# Patient Record
Sex: Male | Born: 1960 | Race: White | Hispanic: No | Marital: Single | Smoking: Never smoker
Health system: Southern US, Community
[De-identification: ages and names within clinical notes are randomized; demographics above are authoritative.]

## PROBLEM LIST (undated history)

## (undated) DIAGNOSIS — L409 Psoriasis, unspecified: Secondary | ICD-10-CM

## (undated) DIAGNOSIS — M549 Dorsalgia, unspecified: Secondary | ICD-10-CM

## (undated) DIAGNOSIS — G8929 Other chronic pain: Secondary | ICD-10-CM

## (undated) HISTORY — PX: BACK SURGERY: SHX140

---

## 2012-01-09 ENCOUNTER — Emergency Department (HOSPITAL_BASED_OUTPATIENT_CLINIC_OR_DEPARTMENT_OTHER)
Admission: EM | Admit: 2012-01-09 | Discharge: 2012-01-09 | Discharge status: Against Medical Advice | Payer: 59 | Attending: Emergency Medicine | Admitting: Emergency Medicine

## 2012-01-09 ENCOUNTER — Encounter (HOSPITAL_BASED_OUTPATIENT_CLINIC_OR_DEPARTMENT_OTHER): Payer: Self-pay | Admitting: *Deleted

## 2012-01-09 DIAGNOSIS — IMO0002 Reserved for concepts with insufficient information to code with codable children: Secondary | ICD-10-CM | POA: Insufficient documentation

## 2012-01-09 DIAGNOSIS — X58XXXA Exposure to other specified factors, initial encounter: Secondary | ICD-10-CM | POA: Insufficient documentation

## 2012-01-09 HISTORY — DX: Psoriasis, unspecified: L40.9

## 2012-01-09 HISTORY — DX: Dorsalgia, unspecified: M54.9

## 2012-01-09 HISTORY — DX: Other chronic pain: G89.29

## 2012-01-09 NOTE — ED Notes (Signed)
Has a blister on his right upper leg that his pants rubs against and causes bleeding and irritation constantly. He cannot figure out how to wrap it to prevent this from happening.

## 2012-01-09 NOTE — ED Notes (Signed)
Called in w/a no answer 

## 2012-01-09 NOTE — ED Notes (Signed)
No answer in w/a after being called x 2

## 2012-01-09 NOTE — ED Notes (Signed)
Unable to locate pt in the waiting room.

## 2012-01-10 ENCOUNTER — Emergency Department (HOSPITAL_BASED_OUTPATIENT_CLINIC_OR_DEPARTMENT_OTHER)
Admission: EM | Admit: 2012-01-10 | Discharge: 2012-01-10 | Disposition: A | Discharge status: Home / Self Care | Payer: 59 | Attending: Emergency Medicine | Admitting: Emergency Medicine

## 2012-01-10 ENCOUNTER — Encounter (HOSPITAL_BASED_OUTPATIENT_CLINIC_OR_DEPARTMENT_OTHER): Payer: Self-pay | Admitting: Emergency Medicine

## 2012-01-10 DIAGNOSIS — G8929 Other chronic pain: Secondary | ICD-10-CM | POA: Insufficient documentation

## 2012-01-10 DIAGNOSIS — L02419 Cutaneous abscess of limb, unspecified: Secondary | ICD-10-CM | POA: Insufficient documentation

## 2012-01-10 DIAGNOSIS — L03119 Cellulitis of unspecified part of limb: Secondary | ICD-10-CM

## 2012-01-10 DIAGNOSIS — M549 Dorsalgia, unspecified: Secondary | ICD-10-CM | POA: Insufficient documentation

## 2012-01-10 MED ORDER — CEPHALEXIN 500 MG PO CAPS: 500.0000 mg | ORAL_CAPSULE | Freq: Four times a day (QID) | ORAL | Status: AC

## 2012-01-10 MED ORDER — CEPHALEXIN 250 MG PO CAPS
500.0000 mg | ORAL_CAPSULE | Freq: Once | ORAL | Status: AC
  Administered 2012-01-10: 500 mg via ORAL
  Filled 2012-01-10 (×2): qty 1

## 2012-01-10 MED ORDER — SULFAMETHOXAZOLE-TMP DS 800-160 MG PO TABS: 2.0000 | ORAL_TABLET | Freq: Two times a day (BID) | ORAL | Status: AC

## 2012-01-10 MED ORDER — SULFAMETHOXAZOLE-TMP DS 800-160 MG PO TABS
2.0000 | ORAL_TABLET | Freq: Once | ORAL | Status: AC
  Administered 2012-01-10: 2 via ORAL
  Filled 2012-01-10: qty 2

## 2012-01-10 NOTE — ED Provider Notes (Signed)
History     CSN: [id number]  Arrival date & time 01/10/12  0155   First MD Initiated Contact with Patient 01/10/12 0220      Chief Complaint  Patient presents with  . Mass    (Consider location/radiation/quality/duration/timing/severity/associated sxs/prior treatment) HPI Patient is a 51 year old male who presents today complaining of right inner thigh pain with associated rash for the past week. Patient has had pain in the lump on his right inner thigh that he's noted over the past year he feels like this intermittently gets swollen and the swelling goes down. Over the past week this area has gotten very red. Patient also notes that he has had bloody drainage from this as well. Patient has an area that is proximally 6 inches in length and 4 inches in diameter on the right inner thigh with one small 2 cm opening that has been draining. This is not draining here. Pain as a 9/10. This is worse with palpation and walking due to friction. Resting in use of home pain medications for chronic back pain has made it feel better. Patient denies fevers, nausea, vomiting or history of diabetes or immunocompromise.There are no other associated or modifying factors.   Past Medical History  Diagnosis Date  . Psoriasis   . Chronic back pain     Past Surgical History  Procedure Date  . Back surgery     History reviewed. No pertinent family history.  History  Substance Use Topics  . Smoking status: Never Smoker   . Smokeless tobacco: Not on file  . Alcohol Use: Yes      Review of Systems  HENT: Negative.   Eyes: Negative.   Respiratory: Negative.   Cardiovascular: Negative.   Gastrointestinal: Negative.   Genitourinary: Negative.   Musculoskeletal: Negative.   Skin: Positive for rash.       See HPI  Neurological: Negative.   Hematological: Negative.   Psychiatric/Behavioral: Negative.   All other systems reviewed and are negative.    Allergies  Codeine  Home Medications    Current Outpatient Rx  Name Route Sig Dispense Refill  . OXYCODONE-ACETAMINOPHEN 10-325 MG PO TABS Oral Take 1 tablet by mouth every 4 (four) hours as needed.    Marland Kitchen TRAMADOL HCL 50 MG PO TABS Oral Take 50 mg by mouth every 6 (six) hours as needed.    . CEPHALEXIN 500 MG PO CAPS Oral Take 1 capsule (500 mg total) by mouth 4 (four) times daily. 40 capsule 0  . SULFAMETHOXAZOLE-TMP DS 800-160 MG PO TABS Oral Take 2 tablets by mouth 2 (two) times daily. 40 tablet 0    BP 149/83  Pulse 71  Temp(Src) 98.3 F (36.8 C) (Oral)  Resp 16  SpO2 99%  Physical Exam  Nursing note and vitals reviewed. GEN: Well-developed, well-nourished male in no distress HEENT: Atraumatic, normocephalic. EYES: PERRLA BL, no scleral icterus. NECK: Trachea midline, no meningismus CV: regular rate and rhythm PULM: No respiratory distress.   Neuro: cranial nerves 2-12 grossly intact, no abnormalities of strength or sensation, A and O x 3 MSK: Patient moves all 4 extremities symmetrically, no deformity, edema, or injury noted Skin: Six-inch by 4 inch area of erythema noted over the right inner thigh with a 2 cm linear area that has drained pus previously and is not currently draining. Patient has one area within this is approximately an inch in diameter there is a palpable lump but does not feel indurated or fluctuant. Psych: no abnormality of mood  ED Course  Procedures (including critical care time)  Labs Reviewed - No data to display No results found.   1. Cellulitis and abscess of leg       MDM  Patient was evaluated by myself. Based on evaluation patient did appear to have a cellulitis with an abscess that had been draining. Patient has not been on any antibiotics. He preferred conservative management this evening. Patient was advised to use hot compresses over the opening within his cellulitic area. He came use ice otherwise for pain control. Patient was given 2 tabs of double strength Bactrim as  well as Keflex and discharged with 10 days of both of these. Patient said he can use his home pain medications for pain control. Patient was advised that he he does not have significant improvement in his symptoms within 48 hours that he should return. Patient had outlined place and was told that if his erythema extends beyond this despite antibiotics he should also return. Patient was discharged in good condition.        Cyndra Numbers, MD 01/10/12 [phone]

## 2012-01-10 NOTE — ED Notes (Signed)
Lump on right leg causing pain, present for several months, started to cause pain in last week

## 2012-01-10 NOTE — Discharge Instructions (Signed)
Use ice packs over the reddened area of scan for pain control. Use hot compresses over the open area that has been draining as well as massage to improve drainage of fluid. Do not attempt to open his your self. Please return if you develop worsening of the redness despite antibiotics, nausea, vomiting, fevers, or worsening symptoms even though you are now on antibiotics. Please take your complete course of antibiotics. Abscess An abscess (boil or furuncle) is an infected area that contains a collection of pus.  SYMPTOMS Signs and symptoms of an abscess include pain, tenderness, redness, or hardness. You may feel a moveable soft area under your skin. An abscess can occur anywhere in the body.  TREATMENT  A surgical cut (incision) may be made over your abscess to drain the pus. Gauze may be packed into the space or a drain may be looped through the abscess cavity (pocket). This provides a drain that will allow the cavity to heal from the inside outwards. The abscess may be painful for a few days, but should feel much better if it was drained.  Your abscess, if seen early, may not have localized and may not have been drained. If not, another appointment may be required if it does not get better on its own or with medications. HOME CARE INSTRUCTIONS   Only take over-the-counter or prescription medicines for pain, discomfort, or fever as directed by your caregiver.   Take your antibiotics as directed if they were prescribed. Finish them even if you start to feel better.   Keep the skin and clothes clean around your abscess.   If the abscess was drained, you will need to use gauze dressing to collect any draining pus. Dressings will typically need to be changed 3 or more times a day.   The infection may spread by skin contact with others. Avoid skin contact as much as possible.   Practice good hygiene. This includes regular hand washing, cover any draining skin lesions, and do not share personal care  items.   If you participate in sports, do not share athletic equipment, towels, whirlpools, or personal care items. Shower after every practice or tournament.   If a draining area cannot be adequately covered:   Do not participate in sports.   Children should not participate in day care until the wound has healed or drainage stops.   If your caregiver has given you a follow-up appointment, it is very important to keep that appointment. Not keeping the appointment could result in a much worse infection, chronic or permanent injury, pain, and disability. If there is any problem keeping the appointment, you must call back to this facility for assistance.  SEEK MEDICAL CARE IF:   You develop increased pain, swelling, redness, drainage, or bleeding in the wound site.   You develop signs of generalized infection including muscle aches, chills, fever, or a general ill feeling.   You have an oral temperature above 102 F (38.9 C).  MAKE SURE YOU:   Understand these instructions.   Will watch your condition.   Will get help right away if you are not doing well or get worse.  Document Released: 06/22/2005 Document Revised: 09/01/2011 Document Reviewed: 04/15/2008 [facility] Patient Information 2012 [place], [place].Cellulitis Cellulitis is an infection of the skin and the tissue beneath it. The area is typically red and tender. It is caused by germs (bacteria) (usually staph or strep) that enter the body through cuts or sores. Cellulitis most commonly occurs in the arms  or lower legs.  HOME CARE INSTRUCTIONS   If you are given a prescription for medications which kill germs (antibiotics), take as directed until finished.   If the infection is on the arm or leg, keep the limb elevated as able.   Use a warm cloth several times per day to relieve pain and encourage healing.   See your caregiver for recheck of the infected site as directed if problems arise.   Only take over-the-counter or  prescription medicines for pain, discomfort, or fever as directed by your caregiver.  SEEK MEDICAL CARE IF:   The area of redness (inflammation) is spreading, there are red streaks coming from the infected site, or if a part of the infection begins to turn dark in color.   The joint or bone underneath the infected skin becomes painful after the skin has healed.   The infection returns in the same or another area after it seems to have gone away.   A boil or bump swells up. This may be an abscess.   New, unexplained problems such as pain or fever develop.  SEEK IMMEDIATE MEDICAL CARE IF:   You have a fever.   You or your child feels drowsy or lethargic.   There is vomiting, diarrhea, or lasting discomfort or feeling ill (malaise) with muscle aches and pains.  MAKE SURE YOU:   Understand these instructions.   Will watch your condition.   Will get help right away if you are not doing well or get worse.  Document Released: 06/22/2005 Document Revised: 09/01/2011 Document Reviewed: 04/30/2008 [facility] Patient Information 2012 [place], [place].

## 2012-01-10 NOTE — ED Notes (Signed)
vaseline qauze covered with 4x4 and secured with kerlix applied to right upper leg, per md request. Educated about keeping wound clean and dry, supplies given

## 2016-04-27 ENCOUNTER — Emergency Department (HOSPITAL_BASED_OUTPATIENT_CLINIC_OR_DEPARTMENT_OTHER)
Admission: EM | Admit: 2016-04-27 | Discharge: 2016-04-27 | Disposition: A | Discharge status: Home / Self Care | Payer: 59 | Attending: Emergency Medicine | Admitting: Emergency Medicine

## 2016-04-27 ENCOUNTER — Emergency Department (HOSPITAL_BASED_OUTPATIENT_CLINIC_OR_DEPARTMENT_OTHER): Payer: 59

## 2016-04-27 ENCOUNTER — Encounter (HOSPITAL_BASED_OUTPATIENT_CLINIC_OR_DEPARTMENT_OTHER): Payer: Self-pay | Admitting: *Deleted

## 2016-04-27 DIAGNOSIS — R0789 Other chest pain: Secondary | ICD-10-CM | POA: Insufficient documentation

## 2016-04-27 DIAGNOSIS — M25512 Pain in left shoulder: Secondary | ICD-10-CM | POA: Diagnosis not present

## 2016-04-27 DIAGNOSIS — M79622 Pain in left upper arm: Secondary | ICD-10-CM | POA: Diagnosis not present

## 2016-04-27 DIAGNOSIS — M546 Pain in thoracic spine: Secondary | ICD-10-CM | POA: Diagnosis present

## 2016-04-27 MED ORDER — VALACYCLOVIR HCL 500 MG PO TABS
1000.0000 mg | ORAL_TABLET | Freq: Once | ORAL | Status: AC
  Administered 2016-04-27: 1000 mg via ORAL
  Filled 2016-04-27: qty 2

## 2016-04-27 MED ORDER — TRAMADOL HCL 50 MG PO TABS: 100.0000 mg | ORAL_TABLET | Freq: Once | ORAL | Status: DC

## 2016-04-27 MED ORDER — VALACYCLOVIR HCL 1 G PO TABS: 1000.0000 mg | ORAL_TABLET | Freq: Three times a day (TID) | ORAL | 0 refills | Status: AC

## 2016-04-27 NOTE — ED Provider Notes (Addendum)
[facility] Provider [facility]   CSN: [id number] Arrival date & time: 04/27/16  0100  First Provider Contact:  0105      History   Chief Complaint Chief Complaint  Patient presents with  . Back Pain    HPI Joseph Woods is a 55 y.o. male with a history of chronic back and neck pain. He receives Ultram 50 milligrams, 240 tablets per month, and oxycodone/APAP 10/325, 60 tablets per month from [facility].  He had the gradual onset of pain while at work yesterday evening and this morning. The pain is located in his left shoulder and axilla with a more intense, well localized pain in his left mid back in a dermatomal pattern. The pain was severe earlier but has been waxing and waning. It is worse with moving or breathing. There are associated paresthesias of the left axilla. He had some paresthesias of the fingers of the left hand earlier but these have resolved. He states the pain is different than his usual chronic pain and compares it to a broken rib.  HPI  Past Medical History:  Diagnosis Date  . Chronic back pain   . Psoriasis     There are no active problems to display for this patient.   Past Surgical History:  Procedure Laterality Date  . BACK SURGERY         Home Medications    Prior to Admission medications   Medication Sig Start Date End Date Taking? Authorizing Provider  oxyCODONE-acetaminophen (PERCOCET) 10-325 MG per tablet Take 1 tablet by mouth every 4 (four) hours as needed.    Historical Provider, MD  traMADol (ULTRAM) 50 MG tablet Take 50 mg by mouth every 6 (six) hours as needed.    Historical Provider, MD    Family History No family history on file.  Social History Social History  Substance Use Topics  . Smoking status: Never Smoker  . Smokeless tobacco: Not on file  . Alcohol use Yes     Allergies   Codeine   Review of Systems Review of Systems  All other systems reviewed and are negative.   Physical  Exam Updated Vital Signs BP 153/85 (BP Location: Right Arm)   Pulse 94   Temp 99 F (37.2 C) (Oral)   Resp 18   Ht  (1.88 m)   Wt 270 lb (122.5 kg)   SpO2 99%   BMI 34.67 kg/m   Physical Exam General: Well-developed, well-nourished male in no acute distress; appearance consistent with age of record HENT: normocephalic; atraumatic Eyes: pupils equal, round and reactive to light; extraocular muscles intact Neck: supple Heart: regular rate and rhythm Lungs: clear to auscultation bilaterally Abdomen: soft; nondistended; nontender; no masses or hepatosplenomegaly; bowel sounds present Back: Tenderness and hyperesthesia of the left lower chest and a dermatomal pattern but no rash seen and no muscle spasm palpated Extremities: No deformity; full range of motion Neurologic: Awake, alert and oriented; motor function intact in all extremities and symmetric; no facial droop; sensation intact and symmetric in the upper extremities Skin: Warm and dry Psychiatric: Flat affect    ED Treatments / Results  Nursing notes and vitals signs, including pulse oximetry, reviewed.  Summary of this visit's results, reviewed by myself:  Imaging Studies: Dg Ribs Unilateral W/chest Left  Result Date: 04/27/2016 CLINICAL DATA:  Severe onset LEFT rib pain while pushing object at work tonight. EXAM: LEFT RIBS AND CHEST - 3+ VIEW COMPARISON:  None. FINDINGS: No fracture or  other bone lesions are seen involving the ribs. There is no evidence of pneumothorax or pleural effusion. Both lungs are clear. Heart size and mediastinal contours are within normal limits. ACDF. IMPRESSION: Negative. Electronically Signed   By: Awilda Metro M.D.   On: 04/27/2016 02:06     Procedures (including critical care time)   Final Clinical Impressions(s) / ED Diagnoses  Nursing staff reports the patient became confused and disoriented trying to find his way back to his room from the bathroom. He has expressed  irritation that we cannot give him a definitive cause for his pain nor can we offer a definitive remedy. As noted above he is already on large doses of chronic narcotic medications. Additional narcotics are not indicated at this time and would be a violation of his pain contract. He was advised to contact [facility] today.   We'll start valacyclovir for possible early shingles. The patient was advised that is not a definitive diagnosis as no rash is present yet.  Final diagnoses:  Chest wall pain      Paula Libra, MD 04/27/16 3500    Paula Libra, MD 04/27/16 [id number]    Paula Libra, MD 04/27/16 [phone]

## 2016-04-27 NOTE — ED Notes (Signed)
Nurse attempting to answer questions for pt and is unable to explain anything further as pt has more in depth questions than she is able to answer about disease process, diagnosis, cause and further treatment.  Dr. Read Drivers sent into room to answer any further questions.

## 2016-04-27 NOTE — ED Notes (Signed)
Patients Supervisor Olive Bass called and discussed options for UDS for post accident. At this time the Patient and the supervisor is unaware if the patient needs a UDS. Patient and supervisor informed that the pateint has up to 24 hours after an injury related to work to have a legal urinary Drug screen completed. Both patient and Supervisor state understanding. Both pateint and supervisor made aware that there is no one able to do a legal UDS after 3 am at this facility and would need to be done at Occup[facility] During the day on 8/2

## 2016-04-27 NOTE — ED Notes (Signed)
Pt is not receptive to dc instructions as he is talking over nurse and doctor when they are trying to explain his paperwork to him.

## 2016-04-27 NOTE — ED Triage Notes (Signed)
Arrived ems from work,  Was pushing object started having pain to left neck, shoulder and flank, w tingling in fingers

## 2016-04-27 NOTE — ED Notes (Addendum)
Pt leaving to go to xray and had to stop at restroom.  Pt apparently became forgetful upon coming out of restroom as to where he was going and had to be reoriented by staff to xray.  Per pt he took his pain medication two hours prior to coming into the ED

## 2016-04-27 NOTE — ED Notes (Signed)
Went into room to clarify with pt as to when he last took pain medication and he informed staff that he took 100mg  ultram at 2230 and just took 100mg  more of his own ultram approximately 5 minutes ago while waiting for xray.  Dr. Read Drivers made aware.

## 2018-03-07 IMAGING — CR DG RIBS W/ CHEST 3+V*L*
3 series · 3 of 3 positions shown · non-contrast
Comparison: None.

CLINICAL DATA: Severe onset LEFT rib pain while pushing object at
work tonight.

EXAM:
LEFT RIBS AND CHEST - 3+ VIEW

[w chest pa]
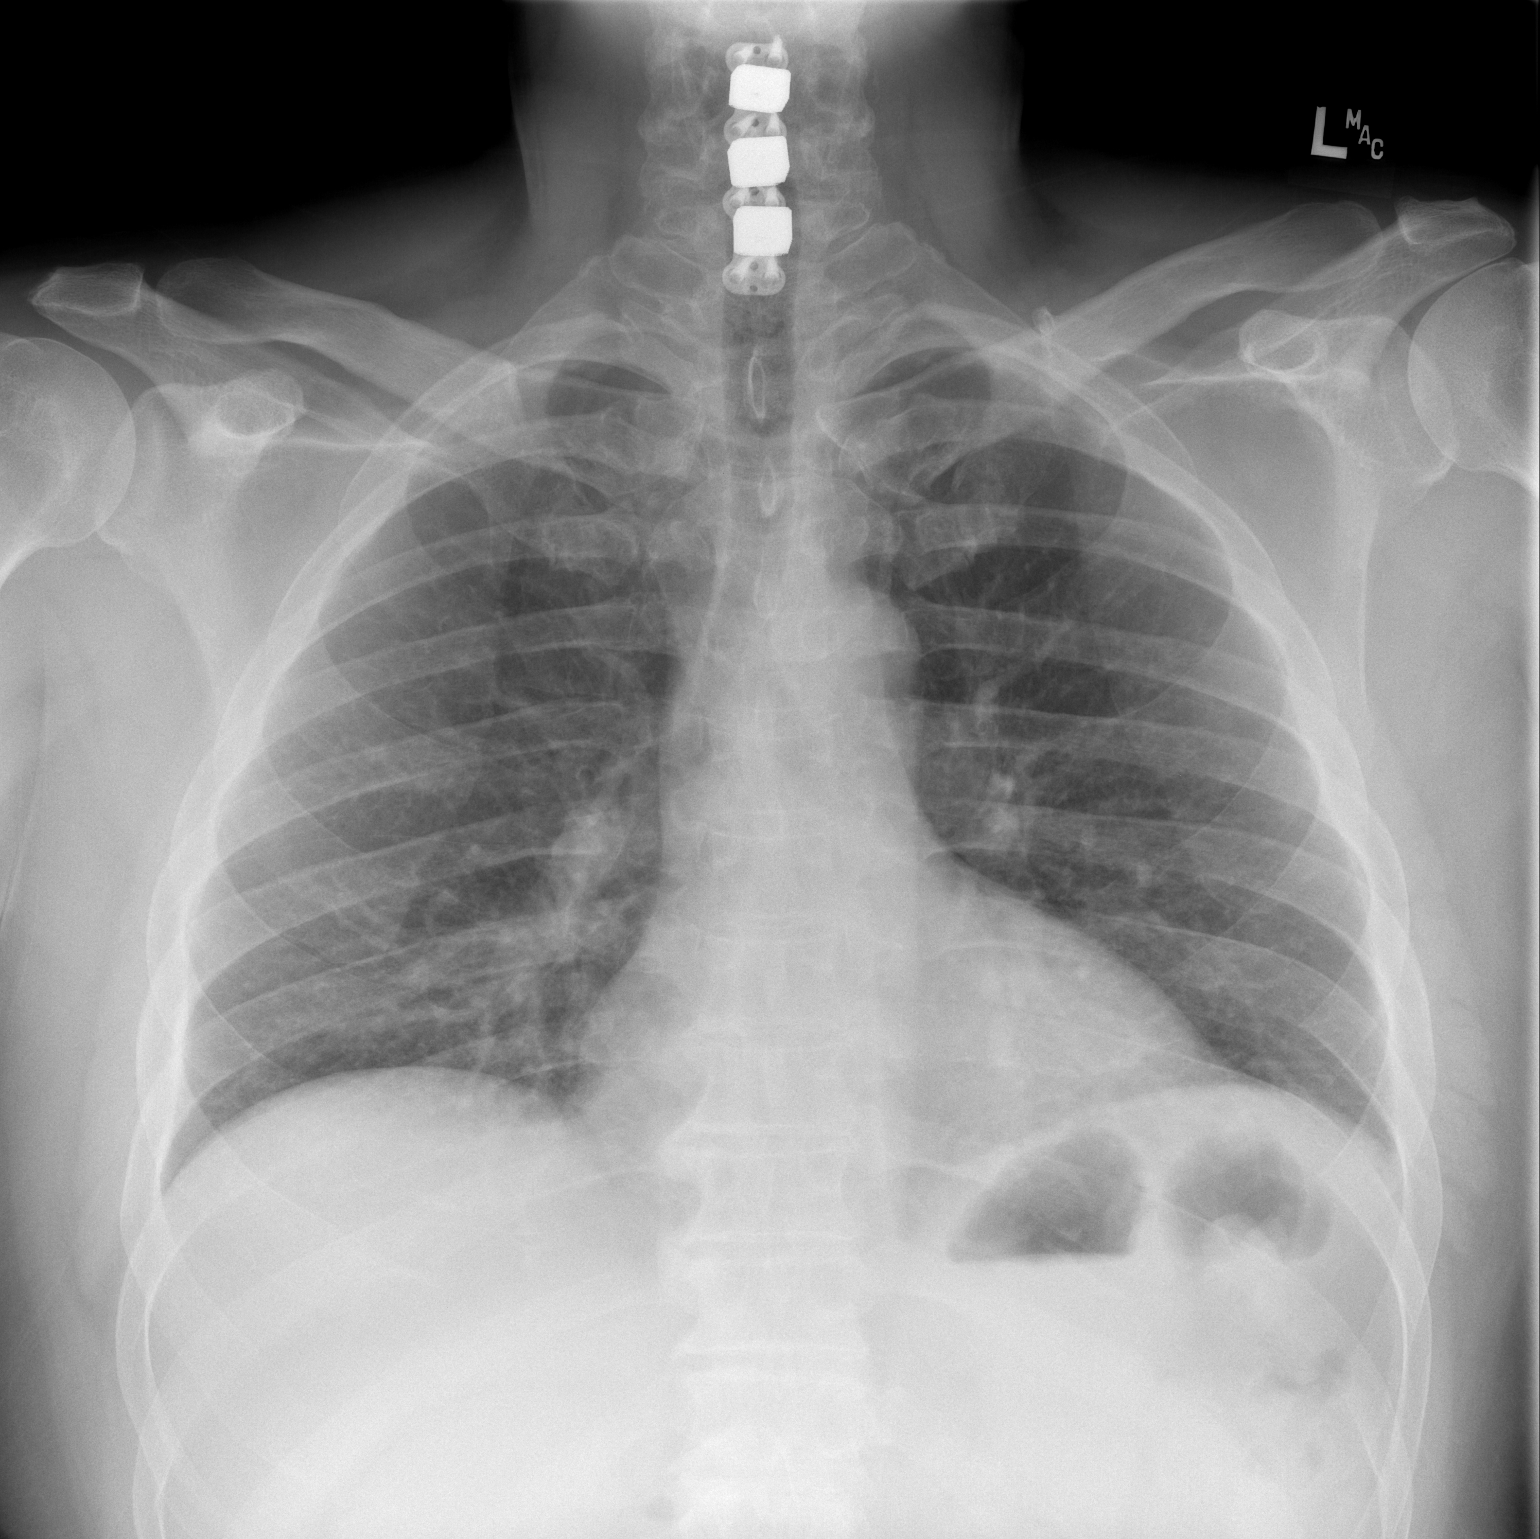

[w ribs ap/pa upper left *]
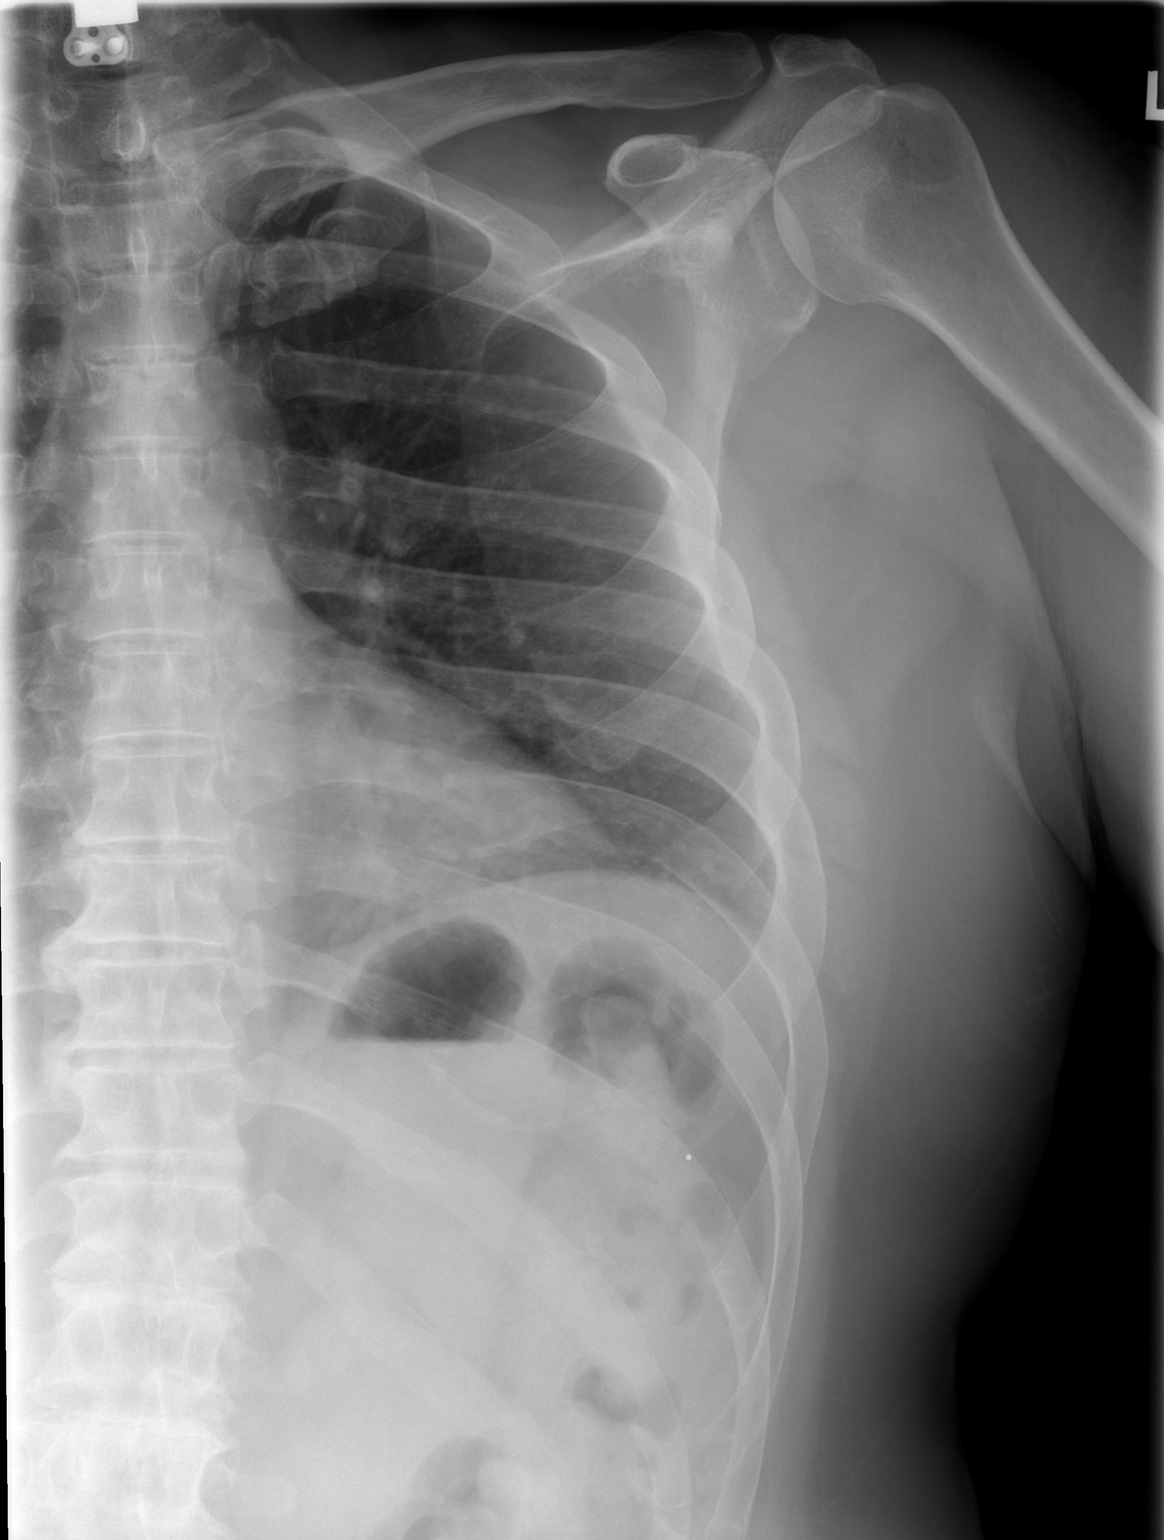

[w ribs oblique left]
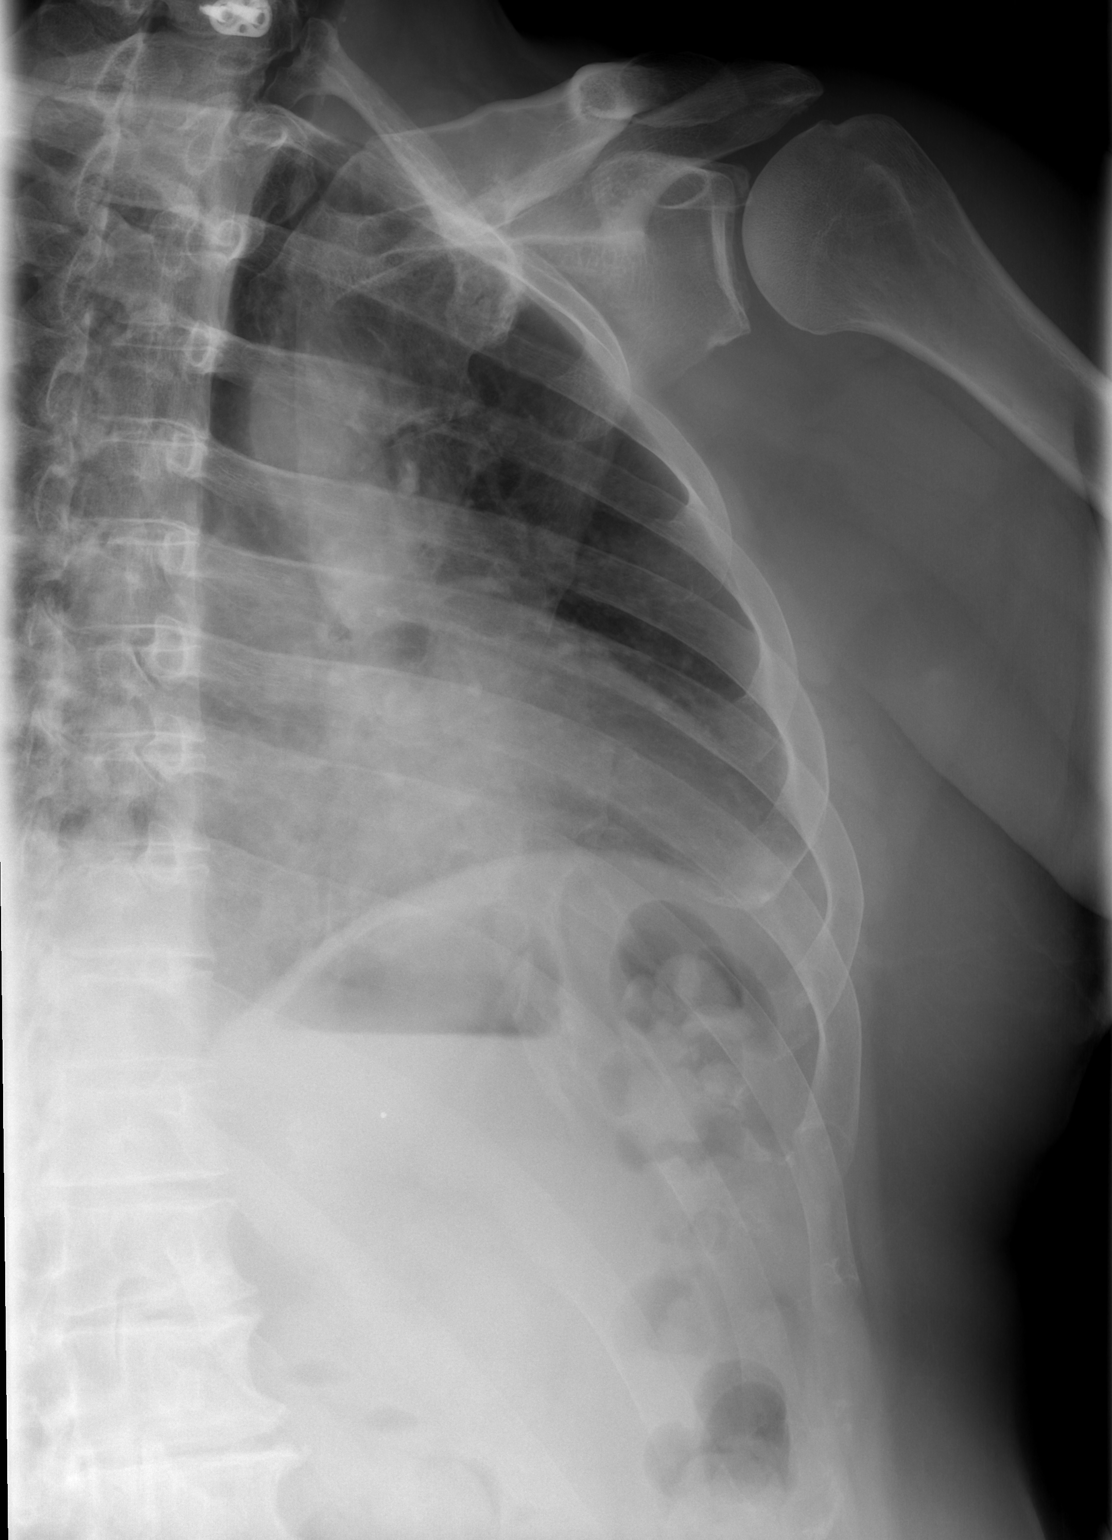

[3 of 3 positions shown; findings below may reference images not displayed]

FINDINGS: No fracture or other bone lesions are seen involving the ribs. There
is no evidence of pneumothorax or pleural effusion. Both lungs are
clear. Heart size and mediastinal contours are within normal limits.
ACDF.
IMPRESSION: Negative.
